# Patient Record
Sex: Female | Born: 1980 | Race: Black or African American | Hispanic: No | Marital: Single | Smoking: Never smoker
Health system: Southern US, Community
[De-identification: ages and names within clinical notes are randomized; demographics above are authoritative.]

---

## 2016-10-02 ENCOUNTER — Ambulatory Visit (HOSPITAL_COMMUNITY)
Admission: EM | Admit: 2016-10-02 | Discharge: 2016-10-02 | Disposition: A | Discharge status: Home / Self Care | Payer: Self-pay | Attending: Internal Medicine | Admitting: Internal Medicine

## 2016-10-02 ENCOUNTER — Encounter (HOSPITAL_COMMUNITY): Payer: Self-pay | Admitting: Family Medicine

## 2016-10-02 DIAGNOSIS — R3915 Urgency of urination: Secondary | ICD-10-CM | POA: Insufficient documentation

## 2016-10-02 DIAGNOSIS — Z8619 Personal history of other infectious and parasitic diseases: Secondary | ICD-10-CM | POA: Insufficient documentation

## 2016-10-02 DIAGNOSIS — R350 Frequency of micturition: Secondary | ICD-10-CM | POA: Insufficient documentation

## 2016-10-02 DIAGNOSIS — R300 Dysuria: Secondary | ICD-10-CM | POA: Insufficient documentation

## 2016-10-02 DIAGNOSIS — N898 Other specified noninflammatory disorders of vagina: Secondary | ICD-10-CM | POA: Insufficient documentation

## 2016-10-02 LAB — POCT URINALYSIS DIP (DEVICE)
Bilirubin Urine: NEGATIVE
Glucose, UA: NEGATIVE mg/dL
HGB URINE DIPSTICK: NEGATIVE
KETONES UR: NEGATIVE mg/dL
Leukocytes, UA: NEGATIVE
Nitrite: NEGATIVE
PH: 6.5 (ref 5.0–8.0)
PROTEIN: NEGATIVE mg/dL
SPECIFIC GRAVITY, URINE: 1.02 (ref 1.005–1.030)
Urobilinogen, UA: 1 mg/dL (ref 0.0–1.0)

## 2016-10-02 LAB — POCT PREGNANCY, URINE: PREG TEST UR: NEGATIVE

## 2016-10-02 MED ORDER — FLUCONAZOLE 150 MG PO TABS: 150.0000 mg | ORAL_TABLET | Freq: Once | ORAL | 0 refills | Status: AC

## 2016-10-02 NOTE — ED Triage Notes (Signed)
Pt here for dysuria and foul smell when wiping vaginal area.

## 2016-10-02 NOTE — ED Notes (Signed)
Instructed to undress and put on gown for exam.  Pelvic equipment at bedside.  Urine placed in lab

## 2016-10-02 NOTE — ED Provider Notes (Signed)
CSN: [id number]     Arrival date & time 10/02/16  1402 History   None    Chief Complaint  Patient presents with  . Dysuria   (Consider location/radiation/quality/duration/timing/severity/associated sxs/prior Treatment) HPI Complains of pain when she is finishing her stream for one week.  Describes this as a burn. She does associate urinary urgency and frequency. No flank pain, fever, hematuria. This seemed to get better but still feels that something is wrong.  Says that when she wipes "it smells like a boiled egg."  Denies any abnormal vaginal discharge and white is the color.  No abnormal amount of discharge.  She has one partner of 11 years and thinks this a monogamous relationship but she is not sure. She has tried [employer] and says this was somewhat helpful.   She has a history of bacterial vaginosis but tells me this is different as her BV usually comes with a fishy odor.   No past medical history on file. History reviewed. No pertinent surgical history. History reviewed. No pertinent family history. Social History  Substance Use Topics  . Smoking status: Never Smoker  . Smokeless tobacco: Never Used  . Alcohol use Not on file   OB History    No data available     Review of Systems  Gastrointestinal: Negative for abdominal distention, diarrhea and nausea.  Genitourinary: Negative for menstrual problem, pelvic pain, vaginal bleeding and vaginal pain.    Allergies  Patient has no known allergies.  Home Medications   Prior to Admission medications   Medication Sig Start Date End Date Taking? Authorizing Provider  fluconazole (DIFLUCAN) 150 MG tablet Take 1 tablet (150 mg total) by mouth once. Repeat if needed 10/02/16 10/02/16  Ofilia Neaslark, Leathia Farnell L, PA-C   Meds Ordered and Administered this Visit  Medications - No data to display  BP 108/72   Pulse 69   Temp 98.6 F (37 C)   Resp 18   LMP 08/31/2016   SpO2 100%  No data found.   Physical Exam  Constitutional: She is  oriented to person, place, and time. She appears well-developed and well-nourished. No distress.  Eyes: EOM are normal. Pupils are equal, round, and reactive to light.  Cardiovascular: Normal rate, regular rhythm, normal heart sounds and intact distal pulses.   Pulmonary/Chest: Effort normal.  Abdominal: She exhibits no distension.  Genitourinary: Vagina normal. Cervix exhibits discharge (white and mucoid). Cervix exhibits no motion tenderness and no friability.  Musculoskeletal: Normal range of motion.  Neurological: She is alert and oriented to person, place, and time. No cranial nerve deficit. Gait normal.  Skin: Skin is dry. She is not diaphoretic.  Psychiatric: She has a normal mood and affect.  Vitals reviewed.   Urgent Care Course     Procedures (including critical care time)  Labs Review Labs Reviewed  URINE CULTURE  POCT URINALYSIS DIP (DEVICE)  POCT PREGNANCY, URINE  CERVICOVAGINAL ANCILLARY ONLY      MDM   1. Vaginal discharge   Labs out. Fluconazole for now as she did seem to improve some with monostat.     Ofilia Neaslark, Darlisha Kelm L, PA-C 10/02/16 1513    Ofilia Neaslark, Mcarthur Ivins L, PA-C 10/02/16 1515

## 2016-10-03 LAB — CERVICOVAGINAL ANCILLARY ONLY
Bacterial vaginitis: NEGATIVE
CANDIDA VAGINITIS: NEGATIVE
Chlamydia: NEGATIVE
Neisseria Gonorrhea: NEGATIVE
Trichomonas: NEGATIVE

## 2016-10-03 LAB — URINE CULTURE

## 2019-08-22 ENCOUNTER — Emergency Department (HOSPITAL_COMMUNITY): Payer: Self-pay

## 2019-08-22 ENCOUNTER — Inpatient Hospital Stay (HOSPITAL_COMMUNITY): Payer: Self-pay

## 2019-08-22 ENCOUNTER — Inpatient Hospital Stay (HOSPITAL_COMMUNITY)
Admission: EM | Admit: 2019-08-22 | Discharge: 2019-08-25 | DRG: 964 | Disposition: A | Discharge status: Home / Self Care | Payer: Self-pay | Attending: Surgery | Admitting: Surgery

## 2019-08-22 ENCOUNTER — Other Ambulatory Visit: Payer: Self-pay

## 2019-08-22 ENCOUNTER — Encounter (HOSPITAL_COMMUNITY): Payer: Self-pay

## 2019-08-22 DIAGNOSIS — S92001A Unspecified fracture of right calcaneus, initial encounter for closed fracture: Secondary | ICD-10-CM | POA: Diagnosis present

## 2019-08-22 DIAGNOSIS — Z20822 Contact with and (suspected) exposure to covid-19: Secondary | ICD-10-CM | POA: Diagnosis present

## 2019-08-22 DIAGNOSIS — S37002A Unspecified injury of left kidney, initial encounter: Secondary | ICD-10-CM

## 2019-08-22 DIAGNOSIS — D62 Acute posthemorrhagic anemia: Secondary | ICD-10-CM | POA: Diagnosis present

## 2019-08-22 DIAGNOSIS — S37032A Laceration of left kidney, unspecified degree, initial encounter: Secondary | ICD-10-CM | POA: Diagnosis present

## 2019-08-22 DIAGNOSIS — S8261XA Displaced fracture of lateral malleolus of right fibula, initial encounter for closed fracture: Secondary | ICD-10-CM | POA: Diagnosis present

## 2019-08-22 DIAGNOSIS — S0990XA Unspecified injury of head, initial encounter: Secondary | ICD-10-CM

## 2019-08-22 DIAGNOSIS — S92144A Nondisplaced dome fracture of right talus, initial encounter for closed fracture: Secondary | ICD-10-CM | POA: Diagnosis present

## 2019-08-22 DIAGNOSIS — F10129 Alcohol abuse with intoxication, unspecified: Secondary | ICD-10-CM | POA: Diagnosis present

## 2019-08-22 DIAGNOSIS — K661 Hemoperitoneum: Secondary | ICD-10-CM

## 2019-08-22 DIAGNOSIS — Y92410 Unspecified street and highway as the place of occurrence of the external cause: Secondary | ICD-10-CM

## 2019-08-22 DIAGNOSIS — K5900 Constipation, unspecified: Secondary | ICD-10-CM | POA: Diagnosis present

## 2019-08-22 DIAGNOSIS — S36115A Moderate laceration of liver, initial encounter: Principal | ICD-10-CM | POA: Diagnosis present

## 2019-08-22 LAB — COMPREHENSIVE METABOLIC PANEL
ALT: 47 U/L — ABNORMAL HIGH (ref 0–44)
AST: 81 U/L — ABNORMAL HIGH (ref 15–41)
Albumin: 3.9 g/dL (ref 3.5–5.0)
Alkaline Phosphatase: 39 U/L (ref 38–126)
Anion gap: 15 (ref 5–15)
BUN: 13 mg/dL (ref 6–20)
CO2: 16 mmol/L — ABNORMAL LOW (ref 22–32)
Calcium: 9 mg/dL (ref 8.9–10.3)
Chloride: 108 mmol/L (ref 98–111)
Creatinine, Ser: 0.77 mg/dL (ref 0.44–1.00)
GFR calc Af Amer: >60 mL/min (ref >60)
GFR calc non Af Amer: >60 mL/min (ref >60)
Glucose, Bld: 126 mg/dL — ABNORMAL HIGH (ref 70–99)
Potassium: 3.8 mmol/L (ref 3.5–5.1)
Sodium: 139 mmol/L (ref 135–145)
Total Bilirubin: 0.8 mg/dL (ref 0.3–1.2)
Total Protein: 7.2 g/dL (ref 6.5–8.1)

## 2019-08-22 LAB — CBC
HCT: 39.3 % (ref 36.0–46.0)
Hemoglobin: 12.6 g/dL (ref 12.0–15.0)
MCH: 33.2 pg (ref 26.0–34.0)
MCHC: 32.1 g/dL (ref 30.0–36.0)
MCV: 103.7 fL — ABNORMAL HIGH (ref 80.0–100.0)
Platelets: 274 10*3/uL (ref 150–400)
RBC: 3.79 MIL/uL — ABNORMAL LOW (ref 3.87–5.11)
RDW: 12 % (ref 11.5–15.5)
WBC: 9 10*3/uL (ref 4.0–10.5)
nRBC: 0 % (ref 0.0–0.2)

## 2019-08-22 LAB — URINALYSIS, ROUTINE W REFLEX MICROSCOPIC
Bacteria, UA: NONE SEEN
Bilirubin Urine: NEGATIVE
Glucose, UA: NEGATIVE mg/dL
Ketones, ur: NEGATIVE mg/dL
Leukocytes,Ua: NEGATIVE
Nitrite: NEGATIVE
Protein, ur: NEGATIVE mg/dL
Specific Gravity, Urine: 1.01 (ref 1.005–1.030)
pH: 6 (ref 5.0–8.0)

## 2019-08-22 LAB — I-STAT CHEM 8, ED
BUN: 15 mg/dL (ref 6–20)
Calcium, Ion: 1.1 mmol/L — ABNORMAL LOW (ref 1.15–1.40)
Chloride: 106 mmol/L (ref 98–111)
Creatinine, Ser: 0.8 mg/dL (ref 0.44–1.00)
Glucose, Bld: 127 mg/dL — ABNORMAL HIGH (ref 70–99)
HCT: 40 % (ref 36.0–46.0)
Hemoglobin: 13.6 g/dL (ref 12.0–15.0)
Potassium: 3.3 mmol/L — ABNORMAL LOW (ref 3.5–5.1)
Sodium: 142 mmol/L (ref 135–145)
TCO2: 24 mmol/L (ref 22–32)

## 2019-08-22 LAB — RESPIRATORY PANEL BY RT PCR (FLU A&B, COVID)
Influenza A by PCR: NEGATIVE
Influenza B by PCR: NEGATIVE
SARS Coronavirus 2 by RT PCR: NEGATIVE

## 2019-08-22 LAB — PROTIME-INR
INR: 1 (ref 0.8–1.2)
Prothrombin Time: 13.3 seconds (ref 11.4–15.2)

## 2019-08-22 LAB — HIV ANTIBODY (ROUTINE TESTING W REFLEX): HIV Screen 4th Generation wRfx: NONREACTIVE

## 2019-08-22 LAB — TYPE AND SCREEN
ABO/RH(D): O POS
Antibody Screen: NEGATIVE

## 2019-08-22 LAB — I-STAT BETA HCG BLOOD, ED (MC, WL, AP ONLY): I-stat hCG, quantitative: <5 m[IU]/mL (ref <5)

## 2019-08-22 LAB — ETHANOL: Alcohol, Ethyl (B): 158 mg/dL — ABNORMAL HIGH (ref <10)

## 2019-08-22 LAB — LACTIC ACID, PLASMA: Lactic Acid, Venous: 3.3 mmol/L (ref 0.5–1.9)

## 2019-08-22 LAB — ABO/RH: ABO/RH(D): O POS

## 2019-08-22 MED ORDER — SODIUM CHLORIDE 0.9 % IV BOLUS
1000.0000 mL | Freq: Once | INTRAVENOUS | Status: AC
  Administered 2019-08-22: 06:00:00 1000 mL via INTRAVENOUS

## 2019-08-22 MED ORDER — DEXTROSE-NACL 5-0.9 % IV SOLN: INTRAVENOUS | Status: DC

## 2019-08-22 MED ORDER — TETANUS-DIPHTH-ACELL PERTUSSIS 5-2.5-18.5 LF-MCG/0.5 IM SUSP
0.5000 mL | Freq: Once | INTRAMUSCULAR | Status: AC
  Administered 2019-08-22: 0.5 mL via INTRAMUSCULAR
  Filled 2019-08-22: qty 0.5

## 2019-08-22 MED ORDER — SODIUM CHLORIDE 0.9 % IV BOLUS
1000.0000 mL | Freq: Once | INTRAVENOUS | Status: AC
  Administered 2019-08-22: 1000 mL via INTRAVENOUS

## 2019-08-22 MED ORDER — ONDANSETRON HCL 4 MG/2ML IJ SOLN
4.0000 mg | Freq: Four times a day (QID) | INTRAMUSCULAR | Status: DC | PRN
  Administered 2019-08-22 – 2019-08-24 (×4): 4 mg via INTRAVENOUS
  Filled 2019-08-22 (×4): qty 2

## 2019-08-22 MED ORDER — IOHEXOL 300 MG/ML  SOLN
100.0000 mL | Freq: Once | INTRAMUSCULAR | Status: AC | PRN
  Administered 2019-08-22: 100 mL via INTRAVENOUS

## 2019-08-22 MED ORDER — ONDANSETRON 4 MG PO TBDP: 4.0000 mg | ORAL_TABLET | Freq: Four times a day (QID) | ORAL | Status: DC | PRN

## 2019-08-22 MED ORDER — HYDROMORPHONE HCL 1 MG/ML IJ SOLN
1.0000 mg | INTRAMUSCULAR | Status: DC | PRN
  Administered 2019-08-22 – 2019-08-23 (×9): 1 mg via INTRAVENOUS
  Filled 2019-08-22 (×9): qty 1

## 2019-08-22 MED ORDER — TETANUS-DIPHTHERIA TOXOIDS TD 5-2 LFU IM INJ
0.5000 mL | INJECTION | Freq: Once | INTRAMUSCULAR | Status: DC
  Filled 2019-08-22: qty 0.5

## 2019-08-22 NOTE — ED Notes (Signed)
Trauma MD at bedside.

## 2019-08-22 NOTE — ED Notes (Signed)
Pt was able to speak on the phone with a Dr, this has alleviated much of her anxiety once she was able to ask some questions.
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
## 2019-08-22 NOTE — ED Notes (Signed)
Pt father requesting updates. Crawford Givens [phone]

## 2019-08-22 NOTE — ED Notes (Signed)
Pt mother at bedside
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
## 2019-08-23 LAB — CBC
HCT: 32.6 % — ABNORMAL LOW (ref 36.0–46.0)
HCT: 30.5 % — ABNORMAL LOW (ref 36.0–46.0)
HCT: 27.4 % — ABNORMAL LOW (ref 36.0–46.0)
Hemoglobin: 10.9 g/dL — ABNORMAL LOW (ref 12.0–15.0)
Hemoglobin: 9.9 g/dL — ABNORMAL LOW (ref 12.0–15.0)
Hemoglobin: 9.1 g/dL — ABNORMAL LOW (ref 12.0–15.0)
MCH: 34.1 pg — ABNORMAL HIGH (ref 26.0–34.0)
MCH: 33.1 pg (ref 26.0–34.0)
MCH: 34.2 pg — ABNORMAL HIGH (ref 26.0–34.0)
MCHC: 33.4 g/dL (ref 30.0–36.0)
MCHC: 32.5 g/dL (ref 30.0–36.0)
MCHC: 33.2 g/dL (ref 30.0–36.0)
MCV: 101.9 fL — ABNORMAL HIGH (ref 80.0–100.0)
MCV: 102 fL — ABNORMAL HIGH (ref 80.0–100.0)
MCV: 103 fL — ABNORMAL HIGH (ref 80.0–100.0)
Platelets: 204 10*3/uL (ref 150–400)
Platelets: 191 10*3/uL (ref 150–400)
Platelets: 175 10*3/uL (ref 150–400)
RBC: 3.2 MIL/uL — ABNORMAL LOW (ref 3.87–5.11)
RBC: 2.99 MIL/uL — ABNORMAL LOW (ref 3.87–5.11)
RBC: 2.66 MIL/uL — ABNORMAL LOW (ref 3.87–5.11)
RDW: 12.2 % (ref 11.5–15.5)
RDW: 12 % (ref 11.5–15.5)
RDW: 12.2 % (ref 11.5–15.5)
WBC: 8.8 10*3/uL (ref 4.0–10.5)
WBC: 8.1 10*3/uL (ref 4.0–10.5)
WBC: 6.3 10*3/uL (ref 4.0–10.5)
nRBC: 0 % (ref 0.0–0.2)
nRBC: 0 % (ref 0.0–0.2)
nRBC: 0 % (ref 0.0–0.2)

## 2019-08-23 LAB — BASIC METABOLIC PANEL
Anion gap: 7 (ref 5–15)
BUN: 5 mg/dL — ABNORMAL LOW (ref 6–20)
CO2: 24 mmol/L (ref 22–32)
Calcium: 7.8 mg/dL — ABNORMAL LOW (ref 8.9–10.3)
Chloride: 107 mmol/L (ref 98–111)
Creatinine, Ser: 0.59 mg/dL (ref 0.44–1.00)
GFR calc Af Amer: >60 mL/min (ref >60)
GFR calc non Af Amer: >60 mL/min (ref >60)
Glucose, Bld: 112 mg/dL — ABNORMAL HIGH (ref 70–99)
Potassium: 3.9 mmol/L (ref 3.5–5.1)
Sodium: 138 mmol/L (ref 135–145)

## 2019-08-23 MED ORDER — DOCUSATE SODIUM 100 MG PO CAPS
100.0000 mg | ORAL_CAPSULE | Freq: Two times a day (BID) | ORAL | Status: DC
  Administered 2019-08-23 – 2019-08-25 (×5): 100 mg via ORAL
  Filled 2019-08-23 (×5): qty 1

## 2019-08-23 MED ORDER — ACETAMINOPHEN 500 MG PO TABS
1000.0000 mg | ORAL_TABLET | Freq: Three times a day (TID) | ORAL | Status: DC
  Administered 2019-08-23 (×2): 1000 mg via ORAL
  Filled 2019-08-23: qty 2

## 2019-08-23 MED ORDER — TRAMADOL HCL 50 MG PO TABS: 50.0000 mg | ORAL_TABLET | Freq: Four times a day (QID) | ORAL | Status: DC

## 2019-08-23 MED ORDER — HYDROMORPHONE HCL 1 MG/ML IJ SOLN
1.0000 mg | INTRAMUSCULAR | Status: DC | PRN
  Administered 2019-08-23: 2 mg via INTRAVENOUS
  Filled 2019-08-23: qty 2

## 2019-08-23 MED ORDER — HYDROMORPHONE HCL 1 MG/ML IJ SOLN: 1.0000 mg | INTRAMUSCULAR | Status: DC | PRN

## 2019-08-23 MED ORDER — TRAMADOL HCL 50 MG PO TABS
50.0000 mg | ORAL_TABLET | Freq: Four times a day (QID) | ORAL | Status: DC
  Administered 2019-08-23 – 2019-08-25 (×8): 50 mg via ORAL
  Filled 2019-08-23 (×8): qty 1

## 2019-08-23 MED ORDER — METHOCARBAMOL 500 MG PO TABS
1000.0000 mg | ORAL_TABLET | Freq: Three times a day (TID) | ORAL | Status: DC
  Administered 2019-08-23 – 2019-08-25 (×6): 1000 mg via ORAL
  Filled 2019-08-23 (×7): qty 2

## 2019-08-23 MED ORDER — POLYETHYLENE GLYCOL 3350 17 G PO PACK
17.0000 g | PACK | Freq: Every day | ORAL | Status: DC | PRN
  Administered 2019-08-23: 17 g via ORAL
  Filled 2019-08-23: qty 1

## 2019-08-23 MED ORDER — KETOROLAC TROMETHAMINE 15 MG/ML IJ SOLN: 15.0000 mg | Freq: Four times a day (QID) | INTRAMUSCULAR | Status: DC | PRN

## 2019-08-23 MED ORDER — ACETAMINOPHEN 500 MG PO TABS
1000.0000 mg | ORAL_TABLET | Freq: Four times a day (QID) | ORAL | Status: DC
  Administered 2019-08-24 – 2019-08-25 (×7): 1000 mg via ORAL
  Filled 2019-08-23 (×8): qty 2

## 2019-08-23 MED ORDER — METHOCARBAMOL 500 MG PO TABS
500.0000 mg | ORAL_TABLET | Freq: Four times a day (QID) | ORAL | Status: DC
  Administered 2019-08-23 (×2): 500 mg via ORAL
  Filled 2019-08-23: qty 1

## 2019-08-23 MED ORDER — LACTATED RINGERS IV SOLN: INTRAVENOUS | Status: DC

## 2019-08-23 MED ORDER — OXYCODONE HCL 5 MG PO TABS
5.0000 mg | ORAL_TABLET | ORAL | Status: DC | PRN
  Administered 2019-08-23 – 2019-08-25 (×9): 10 mg via ORAL
  Filled 2019-08-23 (×9): qty 2

## 2019-08-23 NOTE — Progress Notes (Signed)
MD paged with regards to patient/s uncontrolled pain. MD changed the dilaudid 1mg  every 2hrs to dilaudid 1-2 mg every 2hs.

## 2019-08-23 NOTE — Social Work (Signed)
CSW met with pt at bedside. CSW introduced self and explained her role at the hospital. CSW completed sbirt with pt. Pt scored a 3 on the sbirt scale. Pt stated she may drink alcohol 1-2 a month and may have 2-3 drinks at a time. Pt stated she does not have a problem with alcohol. CSW stated she does not use any substances. Pt did not need resources at this time.   Emeterio Reeve, Latanya Presser, [place] Social Worker [phone]
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
## 2019-08-23 NOTE — Progress Notes (Signed)
Orthopedic Tech Progress Note Patient Details:  Gina Walter 03-19-1981 [id number]  Ortho Devices Type of Ortho Device: Short leg splint Ortho Device/Splint Location: RLE Ortho Device/Splint Interventions: Ordered, Application   Post Interventions Patient Tolerated: Well Instructions Provided: Care of device   Donald Pore 08/23/2019, 9:03 AM
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
## 2019-08-24 LAB — BASIC METABOLIC PANEL
Anion gap: 4 — ABNORMAL LOW (ref 5–15)
BUN: <5 mg/dL — ABNORMAL LOW (ref 6–20)
CO2: 27 mmol/L (ref 22–32)
Calcium: 8.1 mg/dL — ABNORMAL LOW (ref 8.9–10.3)
Chloride: 106 mmol/L (ref 98–111)
Creatinine, Ser: 0.59 mg/dL (ref 0.44–1.00)
GFR calc Af Amer: >60 mL/min (ref >60)
GFR calc non Af Amer: >60 mL/min (ref >60)
Glucose, Bld: 87 mg/dL (ref 70–99)
Potassium: 3.1 mmol/L — ABNORMAL LOW (ref 3.5–5.1)
Sodium: 137 mmol/L (ref 135–145)

## 2019-08-24 LAB — CBC
HCT: 28.7 % — ABNORMAL LOW (ref 36.0–46.0)
Hemoglobin: 9.5 g/dL — ABNORMAL LOW (ref 12.0–15.0)
MCH: 33.3 pg (ref 26.0–34.0)
MCHC: 33.1 g/dL (ref 30.0–36.0)
MCV: 100.7 fL — ABNORMAL HIGH (ref 80.0–100.0)
Platelets: 181 10*3/uL (ref 150–400)
RBC: 2.85 MIL/uL — ABNORMAL LOW (ref 3.87–5.11)
RDW: 12 % (ref 11.5–15.5)
WBC: 6.6 10*3/uL (ref 4.0–10.5)
nRBC: 0 % (ref 0.0–0.2)

## 2019-08-24 MED ORDER — POTASSIUM CHLORIDE CRYS ER 20 MEQ PO TBCR
40.0000 meq | EXTENDED_RELEASE_TABLET | Freq: Two times a day (BID) | ORAL | Status: DC
  Administered 2019-08-24 – 2019-08-25 (×3): 40 meq via ORAL
  Filled 2019-08-24 (×3): qty 2

## 2019-08-24 MED ORDER — ENOXAPARIN SODIUM 40 MG/0.4ML ~~LOC~~ SOLN
40.0000 mg | Freq: Every day | SUBCUTANEOUS | Status: DC
  Administered 2019-08-24 – 2019-08-25 (×2): 40 mg via SUBCUTANEOUS
  Filled 2019-08-24 (×2): qty 0.4

## 2019-08-24 MED ORDER — HYDROMORPHONE HCL 1 MG/ML IJ SOLN
1.0000 mg | INTRAMUSCULAR | Status: DC | PRN
  Administered 2019-08-24: 2 mg via INTRAVENOUS
  Filled 2019-08-24: qty 2

## 2019-08-24 MED ORDER — POLYETHYLENE GLYCOL 3350 17 G PO PACK
17.0000 g | PACK | Freq: Every day | ORAL | Status: DC
  Administered 2019-08-24 – 2019-08-25 (×2): 17 g via ORAL
  Filled 2019-08-24 (×2): qty 1
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
## 2019-08-25 LAB — CBC
HCT: 29.5 % — ABNORMAL LOW (ref 36.0–46.0)
Hemoglobin: 9.8 g/dL — ABNORMAL LOW (ref 12.0–15.0)
MCH: 33.6 pg (ref 26.0–34.0)
MCHC: 33.2 g/dL (ref 30.0–36.0)
MCV: 101 fL — ABNORMAL HIGH (ref 80.0–100.0)
Platelets: 192 10*3/uL (ref 150–400)
RBC: 2.92 MIL/uL — ABNORMAL LOW (ref 3.87–5.11)
RDW: 12 % (ref 11.5–15.5)
WBC: 7.1 10*3/uL (ref 4.0–10.5)
nRBC: 0 % (ref 0.0–0.2)

## 2019-08-25 LAB — BASIC METABOLIC PANEL
Anion gap: 7 (ref 5–15)
BUN: 6 mg/dL (ref 6–20)
CO2: 27 mmol/L (ref 22–32)
Calcium: 8.6 mg/dL — ABNORMAL LOW (ref 8.9–10.3)
Chloride: 102 mmol/L (ref 98–111)
Creatinine, Ser: 0.66 mg/dL (ref 0.44–1.00)
GFR calc Af Amer: >60 mL/min (ref >60)
GFR calc non Af Amer: >60 mL/min (ref >60)
Glucose, Bld: 91 mg/dL (ref 70–99)
Potassium: 4.1 mmol/L (ref 3.5–5.1)
Sodium: 136 mmol/L (ref 135–145)

## 2019-08-25 LAB — MAGNESIUM: Magnesium: 1.7 mg/dL (ref 1.7–2.4)

## 2019-08-25 MED ORDER — TAB-A-VITE/IRON PO TABS: 1.0000 | ORAL_TABLET | Freq: Every day | ORAL | 0 refills | Status: AC

## 2019-08-25 MED ORDER — MAGIC MOUTHWASH W/LIDOCAINE
5.0000 mL | Freq: Three times a day (TID) | ORAL | Status: DC | PRN
  Filled 2019-08-25: qty 5

## 2019-08-25 MED ORDER — DOCUSATE SODIUM 100 MG PO CAPS: 100.0000 mg | ORAL_CAPSULE | Freq: Two times a day (BID) | ORAL | 0 refills | Status: AC

## 2019-08-25 MED ORDER — TAB-A-VITE/IRON PO TABS
1.0000 | ORAL_TABLET | Freq: Every day | ORAL | Status: DC
  Administered 2019-08-25: 1 via ORAL
  Filled 2019-08-25: qty 1

## 2019-08-25 MED ORDER — PHENAZOPYRIDINE HCL 100 MG PO TABS
100.0000 mg | ORAL_TABLET | Freq: Three times a day (TID) | ORAL | Status: DC | PRN
  Administered 2019-08-25: 100 mg via ORAL
  Filled 2019-08-25 (×2): qty 1

## 2019-08-25 MED ORDER — POLYETHYLENE GLYCOL 3350 17 G PO PACK: 17.0000 g | PACK | Freq: Every day | ORAL | 0 refills | Status: AC

## 2019-08-25 MED ORDER — ACETAMINOPHEN 500 MG PO TABS: 1000.0000 mg | ORAL_TABLET | Freq: Four times a day (QID) | ORAL | 0 refills | Status: AC | PRN

## 2019-08-25 MED ORDER — BISACODYL 10 MG RE SUPP
10.0000 mg | Freq: Once | RECTAL | Status: AC
  Administered 2019-08-25: 10 mg via RECTAL
  Filled 2019-08-25: qty 1

## 2019-08-25 MED ORDER — OXYCODONE HCL 10 MG PO TABS: 5.0000 mg | ORAL_TABLET | Freq: Four times a day (QID) | ORAL | 0 refills | Status: AC | PRN

## 2019-08-25 MED ORDER — ASPIRIN EC 325 MG PO TBEC: 325.0000 mg | DELAYED_RELEASE_TABLET | Freq: Two times a day (BID) | ORAL | 0 refills | Status: AC

## 2019-08-25 MED ORDER — TRAMADOL HCL 50 MG PO TABS: 50.0000 mg | ORAL_TABLET | Freq: Four times a day (QID) | ORAL | 0 refills | Status: AC | PRN

## 2019-08-25 MED ORDER — METHOCARBAMOL 500 MG PO TABS: 500.0000 mg | ORAL_TABLET | Freq: Three times a day (TID) | ORAL | 0 refills | Status: AC | PRN

## 2019-08-25 MED FILL — ASPIRIN EC 325 MG TABLET: 325 | 30 days supply | Qty: 30 | Fill #0

## 2019-08-25 MED FILL — oxyCODONE HCL 10 MG TABS: 10 | 7 days supply | Qty: 30 | Fill #0

## 2019-08-25 MED FILL — traMADol HCL 50 MG TABS: 50 | 7 days supply | Qty: 30 | Fill #0

## 2019-08-25 MED FILL — METHOCARBAMOL 500 MG TABS: 500 | 5 days supply | Qty: 30 | Fill #0
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
## 2019-08-25 NOTE — Progress Notes (Signed)
Pt with discharge orders. Discharge paperwork reviewed with pt and all questions answered. IVs removed. Pharmacy delivered medications to room. Pt discharged with belongings including medications, BSC, Walker, and belongings brought in with patient.
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
## 2019-08-25 NOTE — TOC Transition Note (Signed)
Transition of Care [facility]) - CM/SW Discharge Note   Patient Details  Name: Gina Walter MRN: [id number] Date of Birth: [birth year]  Transition of [facility]) CM/SW Contact:  Glennon Mac, RN Phone Number: 08/25/2019, 3:48 PM   Clinical Narrative:  Pt medically stable for discharge home today with mother.  She plans to return to mother's house for a few days and then will contact PCP to determine availability of therapies in [place].  Pt states she has full Medicaid of [place], so therapies should be covered.  Referral to [employer] for DME needs; RW and 3 in 1 to be delivered to bedside prior to dc home.      Final next level of care: Home/Self Care Barriers to Discharge: Barriers Resolved                       Discharge Plan and Services   Discharge Planning Services: CM Consult            DME Arranged: 3-N-1, Walker rolling   Date DME Agency Contacted: 08/25/19 Time DME Agency Contacted: 1059 Representative spoke with at DME Agency: Oletha Cruel            Social Determinants of Health (SDOH) Interventions     Readmission Risk Interventions Readmission Risk Prevention Plan 08/25/2019  Post Dischage Appt Not Complete  Appt Comments Pt from [place]; plans to see PCP when she returns  Medication Screening Complete  Transportation Screening Complete  Some recent data might be hidden   Quintella Baton, RN, BSN  Trauma/Neuro ICU Case Manager [phone]
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
## 2019-08-25 NOTE — Discharge Instructions (Addendum)
For right ankle (talus, calcaneus, and lateral malleolus) fracture Nonweightbearing in splint Elevated to decrease swelling Keep splint clean and dry Follow up with orthopedic surgeon in [place] within about 2 weeks   Cast or Splint Care, Adult Casts and splints are supports that are worn to protect broken bones and other injuries. A cast or splint may hold a bone still and in the correct position while it heals. Casts and splints may also help to ease pain, swelling, and muscle spasms. How to care for your cast   Do not stick anything inside the cast to scratch your skin.  Check the skin around the cast every day. Tell your doctor about any concerns.  You may put lotion on dry skin around the edges of the cast. Do not put lotion on the skin under the cast.  Keep the cast clean.  If the cast is not waterproof: ? Do not let it get wet. ? Cover it with a watertight covering when you take a bath or a shower. How to care for your splint   Wear it as told by your doctor. Take it off only as told by your doctor.  Loosen the splint if your fingers or toes tingle, get numb, or turn cold and blue.  Keep the splint clean.  If the splint is not waterproof: ? Do not let it get wet. ? Cover it with a watertight covering when you take a bath or a shower. Follow these instructions at home: Bathing  Do not take baths or swim until your doctor says it is okay. Ask your doctor if you can take showers. You may only be allowed to take sponge baths for bathing.  If your cast or splint is not waterproof, cover it with a watertight covering when you take a bath or shower. Managing pain, stiffness, and swelling  Move your fingers or toes often to avoid stiffness and to lessen swelling.  Raise (elevate) the injured area above the level of your heart while sitting or lying down. Safety  Do not use the injured limb to support your body weight until your doctor says that it is okay.  Use  crutches or other assistive devices as told by your doctor. General instructions  Do not put pressure on any part of the cast or splint until it is fully hardened. This may take many hours.  Return to your normal activities as told by your doctor. Ask your doctor what activities are safe for you.  Keep all follow-up visits as told by your doctor. This is important. Contact a doctor if:  Your cast or splint gets damaged.  The skin around the cast gets red or raw.  The skin under the cast is very itchy or painful.  Your cast or splint feels very uncomfortable.  Your cast or splint is too tight or too loose.  Your cast becomes wet or it starts to have a soft spot or area.  You get an object stuck under your cast. Get help right away if:  Your pain gets worse.  The injured area tingles, gets numb, or turns blue and cold.  The part of your body above or below the cast is swollen and it turns a different color (is discolored).  You cannot feel or move your fingers or toes.  There is fluid leaking through the cast.  You have very bad pain or pressure under the cast.  You have trouble breathing.  You have shortness of breath.  You  have chest pain. This information is not intended to replace advice given to you by your health care provider. Make sure you discuss any questions you have with your health care provider. Document Revised: 08/05/2018 Document Reviewed: 04/05/2016 Elsevier Patient Education  2020 [employer].   For liver and kidney lacerations Bleeding has stopped as your hemoglobin is trending back up Continue multivitamin with iron Expect some blood in urine and mild pain with urination as the kidney laceration heals Avoid any contact sports or activities where you could risk getting hit in your abdomen for 2 months Follow up with your primary care physician in about 3 weeks   Liver Laceration  A liver laceration is a tear or a cut in the liver. The liver  is an organ that is involved in many important bodily functions. Sometimes, a liver laceration can be a very serious injury. It can cause a lot of bleeding, and surgery may be needed. Other times, a liver laceration may be minor, and bed rest may be all that is needed. Either way, treatment in a hospital is almost always required. Liver lacerations are categorized in grades from 1 to 5. Low numbers identify lacerations that are less severe than lacerations with high numbers.  Grade 1: This is a tear in the outer lining of the liver. It is less than  inch (1 cm) deep.  Grade 2: This is a tear that is about  inch to 1 inch (1 to 3 cm) deep. It is less than 4 inches (10 cm) long.  Grade 3: This is a tear that is slightly more than 1 inch (3 cm) deep.  Grades 4 and 5: These lacerations are very deep. They affect a large part of the liver. What are the causes? This condition may be caused by:  A forceful hit to the area around the liver (blunt trauma), such as in a car crash. Blunt trauma can tear the liver even though it does not break the skin.  An injury in which an object goes through the skin and into the liver (penetrating injury), such as a stab or gunshot wound. What are the signs or symptoms? Common symptoms of this condition include:  A swollen and firm abdomen.  Pain in the abdomen.  Tenderness when pressing on the right side of the abdomen. Other symptoms include:  Bleeding from a penetrating wound.  Bruises on the abdomen.  A fast heartbeat.  Taking quick breaths.  Feeling weak and dizzy. How is this diagnosed? To diagnose this condition, your health care provider will do a physical exam and ask about any injuries to the right side of your abdomen. You may have various tests, such as:  Blood tests. Your blood may be tested every few hours. This will show whether you are losing blood.  CT scan. This test is done to check for laceration or bleeding.  Laparoscopy.  This involves placing a small camera into the abdomen and looking directly at the surface of the liver. How is this treated? Treatment depends on how deep the laceration is and how much bleeding you have. Treatment options include:  Monitoring and bed rest at the hospital. You will have tests often.  Receiving donated blood through an IV tube (transfusion) to replace blood that you have lost. You may need several transfusions.  Surgery to pack gauze pads or special material around the laceration to help it heal or to repair the laceration. Follow these instructions at home:  Take over-the-counter  and prescription medicines only as told by your health care provider. Do not take any other medicines unless you ask your health care provider about them first.  Do not drive or use heavy machinery while taking prescription pain medicines.  Rest and limit your activity as told by your health care provider. It may be several months before you can return to your usual routine. Do not participate in activities that involve physical contact or require extra energy until your health care provider approves.  Keep all follow-up visits as told by your health care provider. This is important. Contact a health care provider if:  Your abdominal pain does not go away.  You feel more weak and tired than usual. Get help right away if:  Your abdominal pain gets worse.  You have a cut on your skin that: ? Has more redness, swelling, or pain around it. ? Has more fluid or blood coming from it. ? Feels warm to the touch. ? Has pus or a bad smell coming from it.  You feel dizzy or very weak.  You have trouble breathing.  You have a fever. This information is not intended to replace advice given to you by your health care provider. Make sure you discuss any questions you have with your health care provider. Document Revised: 03/28/2017 Document Reviewed: 12/01/2015 Elsevier Patient Education  2020  [employer].

## 2019-08-29 ENCOUNTER — Encounter: Payer: Self-pay | Admitting: Student

## 2019-08-29 DIAGNOSIS — S92114A Nondisplaced fracture of neck of right talus, initial encounter for closed fracture: Secondary | ICD-10-CM | POA: Insufficient documentation

## 2019-08-29 DIAGNOSIS — S92001A Unspecified fracture of right calcaneus, initial encounter for closed fracture: Secondary | ICD-10-CM | POA: Insufficient documentation

## 2020-12-05 IMAGING — CT CT ANKLE*R* W/O CM
3 series · 14 of 33 positions shown, 17 images · non-contrast
Comparison: Earlier radiograph dated 08/22/2019.

CLINICAL DATA: 38-year-old female with right ankle pain.

EXAM:
CT OF THE RIGHT ANKLE WITHOUT CONTRAST
TECHNIQUE: Multidetector CT imaging of the right ankle was performed according
to the standard protocol. Multiplanar CT image reconstructions were
also generated.

[Series 5: extremity soft tissue · axial · 0.33mm/px · z∈[+192,+332]mm · 6 of 91 slices shown, 8 images]
[im 14/91  soft-tissue]
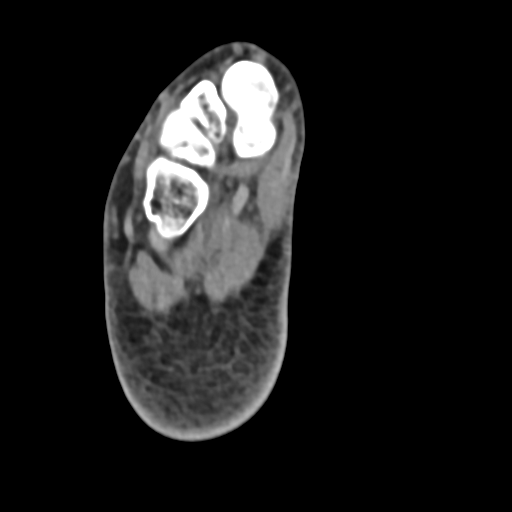
[im 14/91  bone]
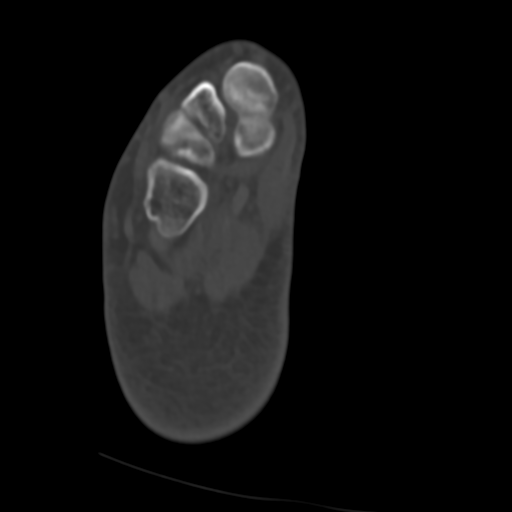
[im 28/91  bone]
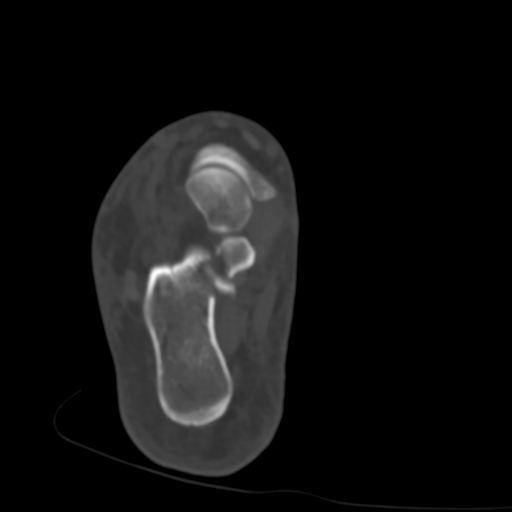
[im 42/91  bone]
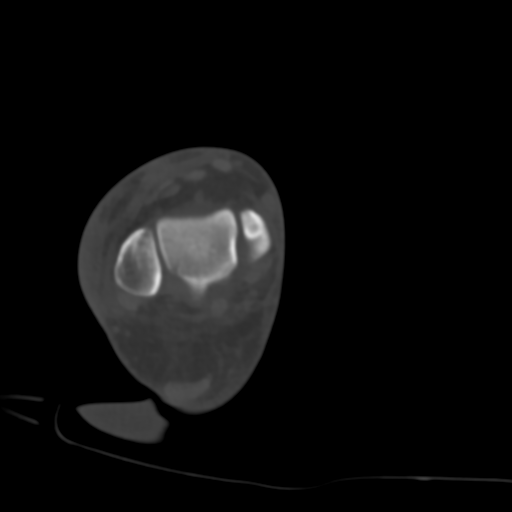
[im 56/91  bone]
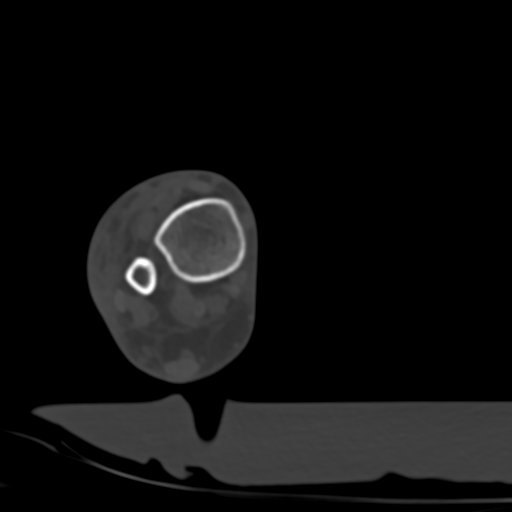
[im 70/91  soft-tissue]
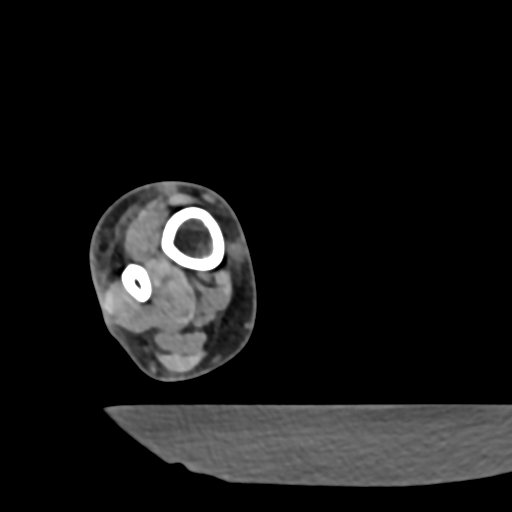
[im 70/91  bone]
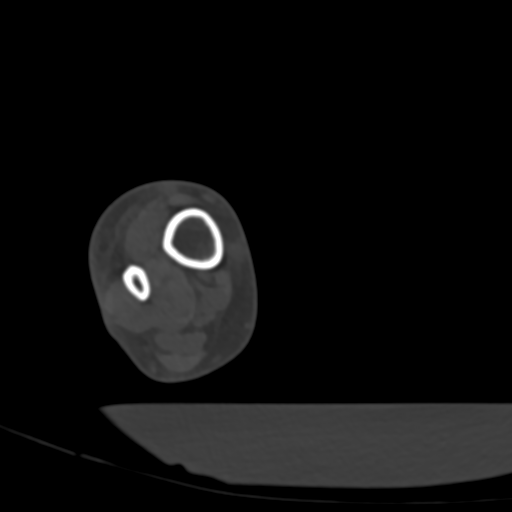
[im 84/91  bone]
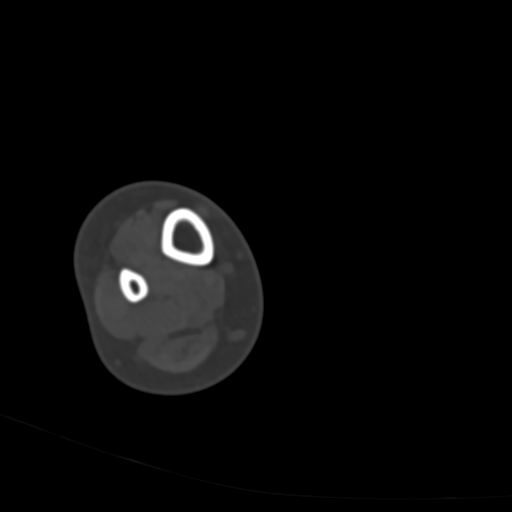

[Series 9: cor soft tissue · coronal · 0.22mm/px · 3 of 73 slices shown]
[im 15/73  bone]
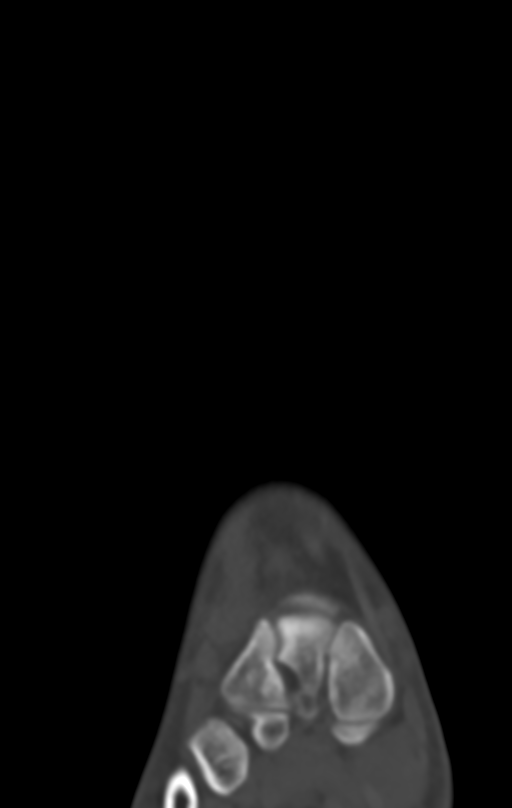
[im 29/73  bone]
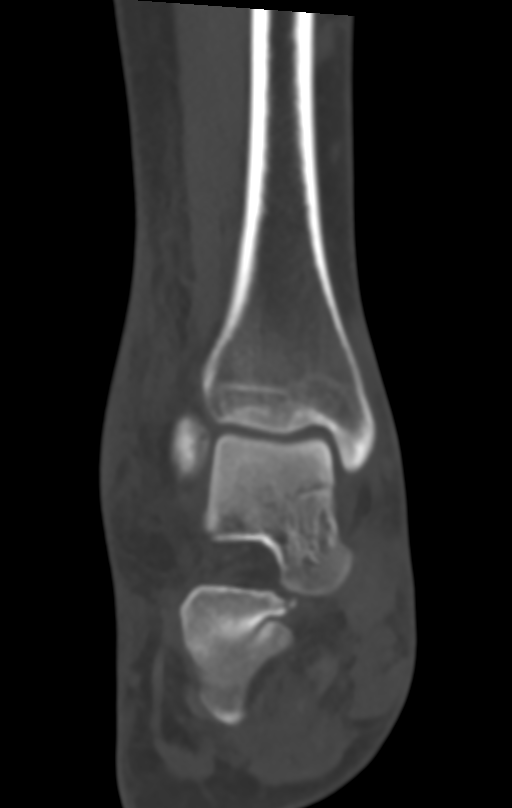
[im 44/73  bone]
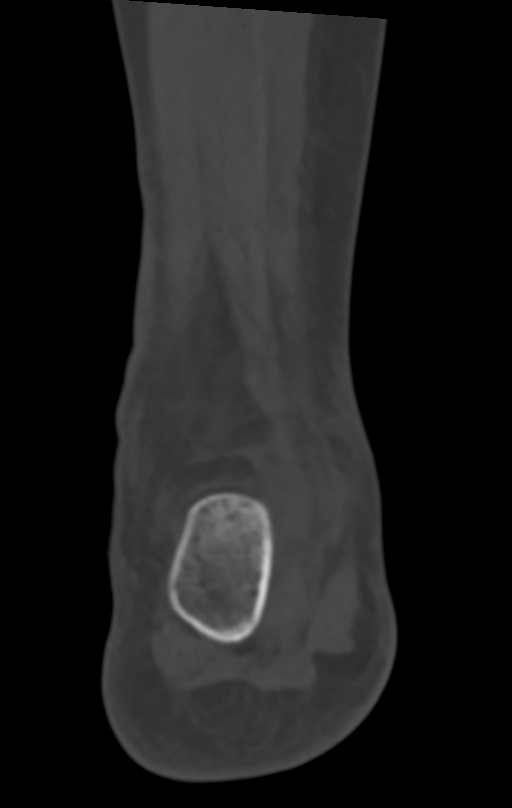

[Series 10: sag soft tissue · sagittal · 0.30mm/px · 5 of 50 slices shown, 6 images]
[im 17/50  bone]
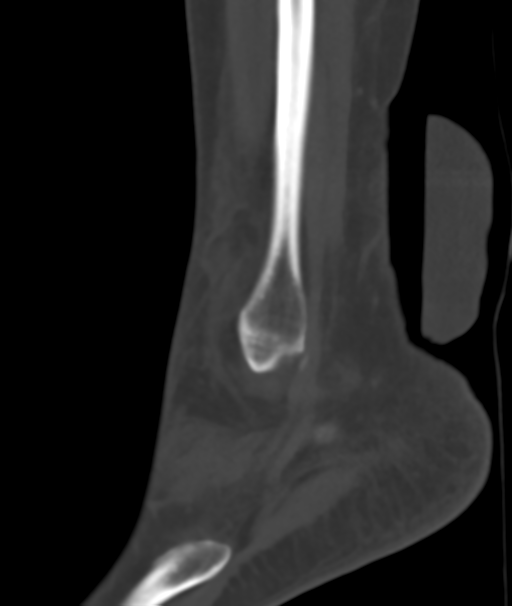
[im 21/50  bone]
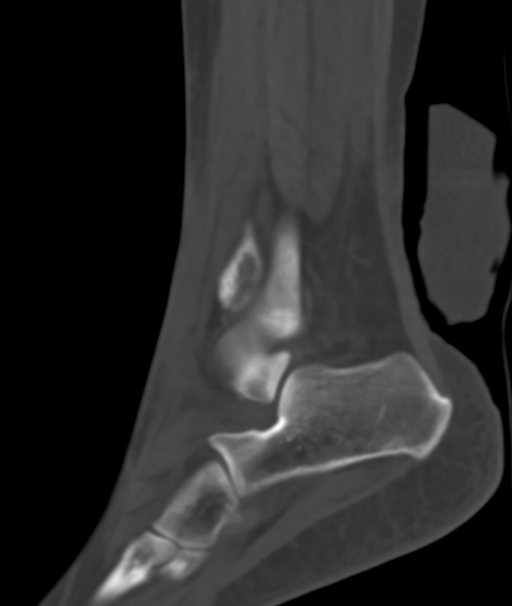
[im 25/50  soft-tissue]
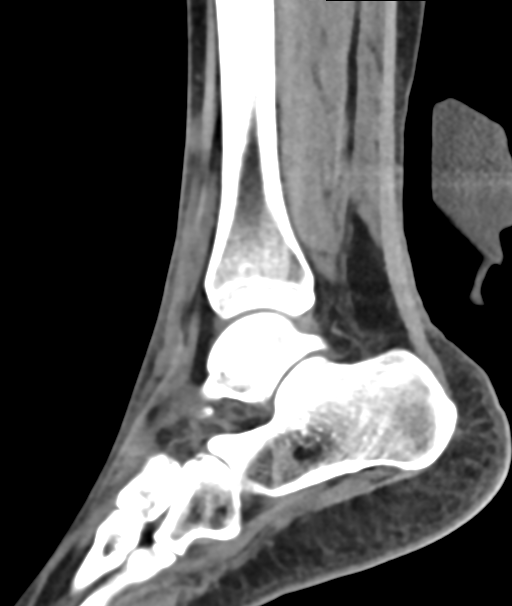
[im 25/50  bone]
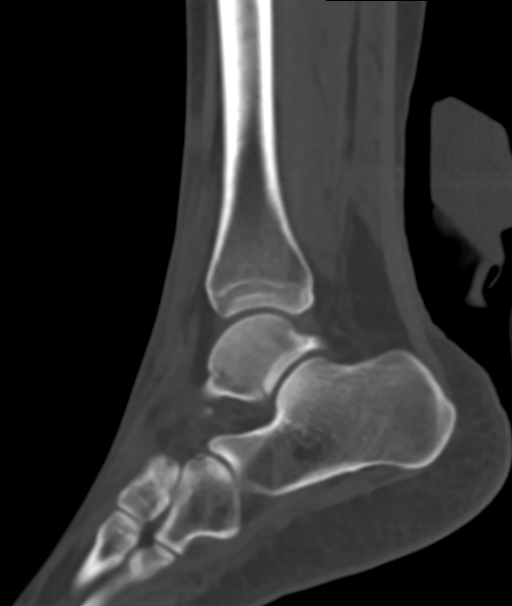
[im 29/50  bone]
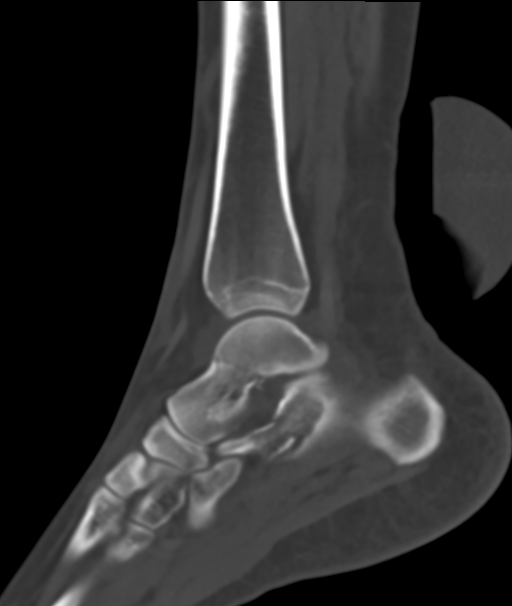
[im 33/50  bone]
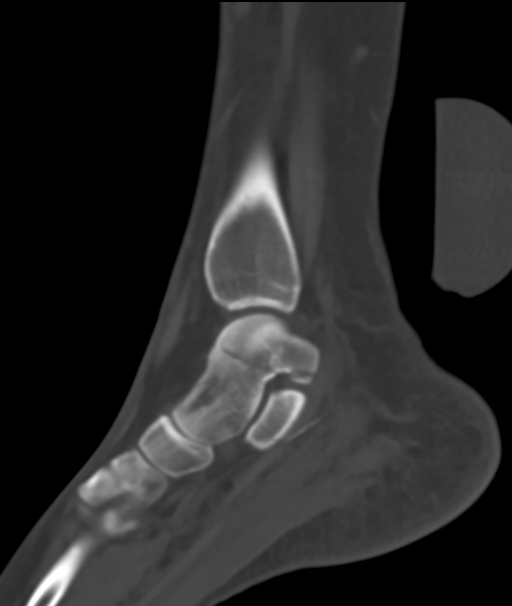

[14 of 33 positions shown; findings below may reference images not displayed]

FINDINGS: Bones/Joint/Cartilage

There is a nondisplaced fracture of the talus extending from the
anterior dome inferiorly to the talar sulcus. There is a minimally
displaced fracture from the inferior aspect of the medial process of
the talus. There is a comminuted and mildly displaced fracture of
the medial calcaneus with involvement of the middle talar articular
process and sustentaculum tali. There is a minimally displaced
cortical fragment along the inferior lateral malleolus. There is no
dislocation.

Ligaments

Suboptimally assessed by CT.

Muscles and Tendons

Suboptimally evaluated.

Soft tissues

Diffuse soft tissue edema of the ankle. No large fluid collection.
IMPRESSION: 1. Nondisplaced fracture of the talus extending from the anterior
dome inferiorly to the talar sulcus. Minimally displaced fracture
from the inferior aspect of the medial process of the talus.
2. Comminuted and mildly displaced fracture of the medial calcaneus
with involvement of the middle talar articular process and
sustentaculum tali.
3. Minimally displaced cortical fragment along the inferior lateral
malleolus.
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
